# Patient Record
Sex: Female | Born: 1995 | Race: Black or African American | Hispanic: No | Marital: Single | Smoking: Never smoker
Health system: Southern US, Community
[De-identification: ages and names within clinical notes are randomized; demographics above are authoritative.]

## PROBLEM LIST (undated history)

## (undated) DIAGNOSIS — R51 Headache: Secondary | ICD-10-CM

## (undated) DIAGNOSIS — R413 Other amnesia: Secondary | ICD-10-CM

## (undated) HISTORY — DX: Headache: R51

## (undated) HISTORY — DX: Other amnesia: R41.3

---
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
## 2016-08-16 ENCOUNTER — Ambulatory Visit (HOSPITAL_COMMUNITY)
Admission: EM | Admit: 2016-08-16 | Discharge: 2016-08-16 | Disposition: A | Discharge status: Home / Self Care | Payer: BLUE CROSS/BLUE SHIELD | Attending: Emergency Medicine | Admitting: Emergency Medicine

## 2016-08-16 ENCOUNTER — Encounter (HOSPITAL_COMMUNITY): Payer: Self-pay | Admitting: Emergency Medicine

## 2016-08-16 DIAGNOSIS — J020 Streptococcal pharyngitis: Secondary | ICD-10-CM | POA: Diagnosis not present

## 2016-08-16 LAB — POCT RAPID STREP A: STREPTOCOCCUS, GROUP A SCREEN (DIRECT): POSITIVE — AB

## 2016-08-16 MED ORDER — AMOXICILLIN 500 MG PO CAPS: 500.0000 mg | ORAL_CAPSULE | Freq: Three times a day (TID) | ORAL | 0 refills | Status: AC

## 2016-08-16 NOTE — Discharge Instructions (Signed)
Your strep test was positive. Take AMOXICILLIN AS DIRECTED UNTIL COMPLETED. Rest,push fluids. May alternate tylenol/ibuprofen as label directed for discomfort. Return to UC as needed.

## 2016-08-16 NOTE — ED Triage Notes (Signed)
The patient presented to the [facility] with a complaint of "something wrong with my throat"" x 3 days.

## 2016-08-16 NOTE — ED Provider Notes (Signed)
CSN: [id number]     Arrival date & time 08/16/16  1348 History   None    Chief Complaint  Patient presents with  . Sore Throat   (Consider location/radiation/quality/duration/timing/severity/associated sxs/prior Treatment) The history is provided by the patient. No language interpreter was used.  Sore Throat  This is a new problem. The current episode started more than 2 days ago. The problem occurs constantly. The problem has not changed since onset.Pertinent negatives include no chest pain, no abdominal pain, no headaches and no shortness of breath. The symptoms are aggravated by swallowing. Nothing relieves the symptoms. She has tried nothing for the symptoms.    Past Medical History:  Diagnosis Date  . Headache(784.0)   . Memory loss    History reviewed. No pertinent surgical history. Family History  Problem Relation Age of Onset  . Cancer Paternal Grandmother     Died at 83   Social History  Substance Use Topics  . Smoking status: Never Smoker  . Smokeless tobacco: Never Used  . Alcohol use No   OB History    No data available     Review of Systems  HENT: Positive for sore throat.   Respiratory: Negative for shortness of breath.   Cardiovascular: Negative for chest pain.  Gastrointestinal: Negative for abdominal pain.  Neurological: Negative for headaches.  All other systems reviewed and are negative.   Allergies  Codeine  Home Medications   Prior to Admission medications   Medication Sig Start Date End Date Taking? Authorizing Provider  amoxicillin (AMOXIL) 500 MG capsule Take 1 capsule (500 mg total) by mouth 3 (three) times daily. 08/16/16   Clancy Gourd, NP   Meds Ordered and Administered this Visit  Medications - No data to display  BP 120/76 (BP Location: Left Arm)   Pulse 69   Temp 98.5 F (36.9 C) (Oral)   Resp 16   SpO2 97%  No data found.   Physical Exam  Constitutional: She is oriented to person, place, and time. Vital signs are  normal. She appears well-developed and well-nourished. She is active and cooperative. No distress.  HENT:  Head: Normocephalic.  Right Ear: Tympanic membrane normal.  Left Ear: Tympanic membrane normal.  Nose: Nose normal.  Mouth/Throat: Posterior oropharyngeal erythema present. No oropharyngeal exudate, posterior oropharyngeal edema or tonsillar abscesses.  Eyes: Conjunctivae, EOM and lids are normal. Pupils are equal, round, and reactive to light.  Neck: Trachea normal and normal range of motion. No tracheal deviation present.  Cardiovascular: Regular rhythm, normal heart sounds and normal pulses.   No murmur heard. Pulmonary/Chest: Effort normal and breath sounds normal.  Abdominal: Soft. Bowel sounds are normal. There is no tenderness.  Musculoskeletal: Normal range of motion.  Lymphadenopathy:    She has no cervical adenopathy.  Neurological: She is alert and oriented to person, place, and time. GCS eye subscore is 4. GCS verbal subscore is 5. GCS motor subscore is 6.  Skin: Skin is warm and dry. No rash noted.  Psychiatric: She has a normal mood and affect. Her speech is normal and behavior is normal.  Nursing note and vitals reviewed.   Urgent Care Course     Procedures (including critical care time)  Labs Review Labs Reviewed  POCT RAPID STREP A - Abnormal; Notable for the following:       Result Value   Streptococcus, Group A Screen (Direct) POSITIVE (*)    All other components within normal limits    Imaging Review No results  found.        MDM   1. Strep throat     1518: Your strep test was positive. Take AMOXICILLIN AS DIRECTED UNTIL COMPLETED. Rest,push fluids. May alternate tylenol/ibuprofen as label directed for discomfort. Return to UC as needed. Pt verbalized understanding to this provider.    Clancy Gourd, NP 08/16/16 [phone]

## 2019-10-27 ENCOUNTER — Other Ambulatory Visit: Payer: Self-pay | Admitting: Radiology

## 2019-10-27 ENCOUNTER — Ambulatory Visit: Payer: Self-pay

## 2019-10-27 ENCOUNTER — Other Ambulatory Visit: Payer: Self-pay

## 2019-10-27 DIAGNOSIS — Z0000 Encounter for general adult medical examination without abnormal findings: Secondary | ICD-10-CM
